# Patient Record
Sex: Male | Born: 1980 | Hispanic: No | Marital: Single | Smoking: Current every day smoker
Health system: Southern US, Community
[De-identification: ages and names within clinical notes are randomized; demographics above are authoritative.]

## PROBLEM LIST (undated history)

## (undated) DIAGNOSIS — Z87442 Personal history of urinary calculi: Secondary | ICD-10-CM

## (undated) HISTORY — PX: NO PAST SURGERIES: SHX2092

---

## 2013-09-14 ENCOUNTER — Ambulatory Visit: Payer: Self-pay

## 2016-08-07 ENCOUNTER — Other Ambulatory Visit: Payer: Self-pay | Admitting: Gastroenterology

## 2016-08-07 DIAGNOSIS — R1012 Left upper quadrant pain: Secondary | ICD-10-CM

## 2016-08-12 ENCOUNTER — Ambulatory Visit
Admission: RE | Admit: 2016-08-12 | Discharge: 2016-08-12 | Disposition: A | Discharge status: Home / Self Care | Payer: Commercial Managed Care - PPO | Source: Ambulatory Visit | Attending: Gastroenterology | Admitting: Gastroenterology

## 2016-08-12 DIAGNOSIS — R1012 Left upper quadrant pain: Secondary | ICD-10-CM

## 2016-08-12 MED ORDER — IOPAMIDOL (ISOVUE-300) INJECTION 61%
100.0000 mL | Freq: Once | INTRAVENOUS | Status: AC | PRN
  Administered 2016-08-12: 100 mL via INTRAVENOUS

## 2016-10-01 ENCOUNTER — Other Ambulatory Visit: Payer: Self-pay | Admitting: Urology

## 2016-10-02 ENCOUNTER — Encounter (HOSPITAL_COMMUNITY): Payer: Self-pay | Admitting: *Deleted

## 2016-10-02 NOTE — H&P (Signed)
HPI: Edward Wiggins is a 36 year-old male with a left renal calculus.  The problem is on the left side. This is his first kidney stone. He is currently having flank pain and nausea. He denies having back pain, groin pain, vomiting, fever, and chills. He has not caught a stone in his urine strainer since his symptoms began.   He has never had surgical treatment for calculi in the past.   36 year-old male, native of [place], comes in today, referred by Dr. Bosie ClosSchooler for evaluation management of left renal atrophy as well as a left sided kidney stone. He has had intermittent left flank pain for several years, perhaps for 10. He denies gross hematuria, dysuria, but he does have intermittent nausea. No significant vomiting. Recent CT scan revealed an 11 mm left renal stone with mild hydronephrosis.     CC/HPI: RENAL ATROPHY   On his CT scan, he does have a discrepancy between right and left renal size. This may well be secondary to the patient's long-standing left renal stone.     ALLERGIES: None   MEDICATIONS: None   GU PSH: None   NON-GU PSH: None   GU PMH: None   NON-GU PMH: None   FAMILY HISTORY: Death In The Family Father - Father   SOCIAL HISTORY: Marital Status: Single Current Smoking Status: Patient smokes.   Tobacco Use Assessment Completed: Used Tobacco in last 30 days? Does drink.  Drinks 2 caffeinated drinks per day.    REVIEW OF SYSTEMS:    GU Review Male:   Patient denies frequent urination, hard to postpone urination, burning/ pain with urination, get up at night to urinate, leakage of urine, stream starts and stops, trouble starting your stream, have to strain to urinate , erection problems, and penile pain.  Gastrointestinal (Upper):   Patient reports nausea. Patient denies vomiting and indigestion/ heartburn.  Gastrointestinal (Lower):   Patient denies diarrhea and constipation.  Constitutional:   Patient reports fatigue. Patient denies fever, night sweats, and  weight loss.  Skin:   Patient denies skin rash/ lesion and itching.  Eyes:   Patient denies blurred vision and double vision.  Ears/ Nose/ Throat:   Patient denies sore throat and sinus problems.  Hematologic/Lymphatic:   Patient denies swollen glands and easy bruising.  Cardiovascular:   Patient denies leg swelling and chest pains.  Respiratory:   Patient denies cough and shortness of breath.  Endocrine:   Patient denies excessive thirst.  Musculoskeletal:   Patient denies back pain and joint pain.  Neurological:   Patient denies headaches and dizziness.  Psychologic:   Patient denies depression and anxiety.   VITAL SIGNS:    Height 131 in / 332.74 cm  BP 117/75 mmHg  Pulse 71 /min  Temperature 99.2 F / 37 C   MULTI-SYSTEM PHYSICAL EXAMINATION:    Constitutional: Well-nourished. No physical deformities. Normally developed. Good grooming.  Neck: Neck symmetrical, not swollen. Normal tracheal position.  Respiratory: No labored breathing, no use of accessory muscles.   Skin: No paleness, no jaundice, no cyanosis. No lesion, no ulcer, no rash.  Neurologic / Psychiatric: Oriented to time, oriented to place, oriented to person. No depression, no anxiety, no agitation.  Gastrointestinal: No mass, no tenderness, no rigidity, non obese abdomen.  Eyes: Normal conjunctivae. Normal eyelids.  Ears, Nose, Mouth, and Throat: Left ear no scars, no lesions, no masses. Right ear no scars, no lesions, no masses. Nose no scars, no lesions, no masses. Normal hearing. Normal lips.  Musculoskeletal: Normal gait and station of head and neck.     PAST DATA REVIEWED:  Source Of History:  Patient  Records Review:   Previous Doctor Records  Urine Test Review:   Urinalysis  X-Ray Review: C.T. Abdomen/Pelvis: Reviewed Films. Reviewed Report. Discussed With Patient. Hounsfield units are approximately 450. Minimal skin to stone distance.     Urinalysis Dipstick Dipstick Cont'd  Color: Straw Bilirubin: Neg   Appearance: Clear Ketones: Neg  Specific Gravity: 1.010 Blood: Neg  pH: 7.0 Protein: Neg  Glucose: Neg Urobilinogen: 0.2    Nitrites: Neg    Leukocyte Esterase: Neg    ASSESSMENT/PLAN:      ICD-10 Details  1 GU:   Renal calculus - N20.0 moderate to large left renal pelvic stone. Hounsfield units approximately 450, although this can be seen on a scout film. Skin to stone distance approximately 7 cm. This has been symptomatic for some time. The patient is anxious to have this treated.   Dr. Retta Diones discussed, to the best of his ability, treatment options including lithotripsy using shockwave, ureteroscopy and holmium laser lithotripsy as well as percutaneous nephrolithotomy. This stone seems fairly amenable to shockwave lithotripsy due to the patient's low skin to stone distance and the low Hounsfield units. I think it is worth a shot at lithotripsy. I discussed the procedure with the patient and have given him a monograph about this.

## 2016-10-05 ENCOUNTER — Encounter (HOSPITAL_COMMUNITY)
Admission: RE | Disposition: A | Discharge status: Home / Self Care | Payer: Self-pay | Source: Ambulatory Visit | Attending: Urology

## 2016-10-05 ENCOUNTER — Encounter (HOSPITAL_COMMUNITY): Payer: Self-pay | Admitting: *Deleted

## 2016-10-05 ENCOUNTER — Ambulatory Visit (HOSPITAL_COMMUNITY): Payer: Commercial Managed Care - PPO

## 2016-10-05 ENCOUNTER — Ambulatory Visit (HOSPITAL_COMMUNITY)
Admission: RE | Admit: 2016-10-05 | Discharge: 2016-10-05 | Disposition: A | Discharge status: Home / Self Care | Payer: Commercial Managed Care - PPO | Source: Ambulatory Visit | Attending: Urology | Admitting: Urology

## 2016-10-05 DIAGNOSIS — N132 Hydronephrosis with renal and ureteral calculous obstruction: Secondary | ICD-10-CM | POA: Diagnosis present

## 2016-10-05 DIAGNOSIS — F17210 Nicotine dependence, cigarettes, uncomplicated: Secondary | ICD-10-CM | POA: Insufficient documentation

## 2016-10-05 DIAGNOSIS — N200 Calculus of kidney: Secondary | ICD-10-CM

## 2016-10-05 HISTORY — DX: Personal history of urinary calculi: Z87.442

## 2016-10-05 HISTORY — PX: EXTRACORPOREAL SHOCK WAVE LITHOTRIPSY: SHX1557

## 2016-10-05 SURGERY — LITHOTRIPSY, ESWL
Anesthesia: LOCAL | Laterality: Left

## 2016-10-05 MED ORDER — OXYCODONE HCL 10 MG PO TABS: 10.0000 mg | ORAL_TABLET | ORAL | 0 refills | Status: AC | PRN

## 2016-10-05 MED ORDER — LEVOFLOXACIN 500 MG PO TABS
500.0000 mg | ORAL_TABLET | ORAL | Status: AC
  Administered 2016-10-05: 500 mg via ORAL
  Filled 2016-10-05: qty 1

## 2016-10-05 MED ORDER — DIAZEPAM 5 MG PO TABS
10.0000 mg | ORAL_TABLET | ORAL | Status: AC
Start: 2016-10-05 — End: 2016-10-05
  Administered 2016-10-05: 10 mg via ORAL
  Filled 2016-10-05: qty 2

## 2016-10-05 MED ORDER — DIPHENHYDRAMINE HCL 25 MG PO CAPS
25.0000 mg | ORAL_CAPSULE | ORAL | Status: AC
  Administered 2016-10-05: 25 mg via ORAL
  Filled 2016-10-05: qty 1

## 2016-10-05 MED ORDER — SODIUM CHLORIDE 0.9 % IV SOLN
INTRAVENOUS | Status: DC
  Administered 2016-10-05: 06:00:00 via INTRAVENOUS

## 2016-10-05 MED ORDER — TAMSULOSIN HCL 0.4 MG PO CAPS: 0.4000 mg | ORAL_CAPSULE | ORAL | 0 refills | Status: AC

## 2016-10-05 NOTE — Discharge Instructions (Signed)
Moderate Conscious Sedation, Adult, Care After °These instructions provide you with information about caring for yourself after your procedure. Your health care provider may also give you more specific instructions. Your treatment has been planned according to current medical practices, but problems sometimes occur. Call your health care provider if you have any problems or questions after your procedure. °What can I expect after the procedure? °After your procedure, it is common: °· To feel sleepy for several hours. °· To feel clumsy and have poor balance for several hours. °· To have poor judgment for several hours. °· To vomit if you eat too soon. °Follow these instructions at home: °For at least 24 hours after the procedure:  ° °· Do not: °¨ Participate in activities where you could fall or become injured. °¨ Drive. °¨ Use heavy machinery. °¨ Drink alcohol. °¨ Take sleeping pills or medicines that cause drowsiness. °¨ Make important decisions or sign legal documents. °¨ Take care of children on your own. °· Rest. °Eating and drinking  °· Follow the diet recommended by your health care provider. °· If you vomit: °¨ Drink water, juice, or soup when you can drink without vomiting. °¨ Make sure you have little or no nausea before eating solid foods. °General instructions  °· Have a responsible adult stay with you until you are awake and alert. °· Take over-the-counter and prescription medicines only as told by your health care provider. °· If you smoke, do not smoke without supervision. °· Keep all follow-up visits as told by your health care provider. This is important. °Contact a health care provider if: °· You keep feeling nauseous or you keep vomiting. °· You feel light-headed. °· You develop a rash. °· You have a fever. °Get help right away if: °· You have trouble breathing. °This information is not intended to replace advice given to you by your health care provider. Make sure you discuss any questions you  have with your health care provider. °Document Released: 02/22/2013 Document Revised: 10/07/2015 Document Reviewed: 08/24/2015 °Elsevier Interactive Patient Education © 2017 [employer] °Lithotripsy, Care After °This sheet gives you information about how to care for yourself after your procedure. Your health care provider may also give you more specific instructions. If you have problems or questions, contact your health care provider. °What can I expect after the procedure? °After the procedure, it is common to have: °· Some blood in your urine. This should only last for a few days. °· Soreness in your back, sides, or upper abdomen for a few days. °· Blotches or bruises on your back where the pressure wave entered the skin. °· Pain, discomfort, or nausea when pieces (fragments) of the kidney stone move through the tube that carries urine from the kidney to the bladder (ureter). Stone fragments may pass soon after the procedure, but they may continue to pass for up to 4-8 weeks. °¨ If you have severe pain or nausea, contact your health care provider. This may be caused by a large stone that was not broken up, and this may mean that you need more treatment. °· Some pain or discomfort during urination. °· Some pain or discomfort in the lower abdomen or (in men) at the base of the penis. °Follow these instructions at home: °Medicines  °· Take over-the-counter and prescription medicines only as told by your health care provider. °· If you were prescribed an antibiotic medicine, take it as told by your health care provider. Do not stop taking the antibiotic even   if you start to feel better. °· Do not drive for 24 hours if you were given a medicine to help you relax (sedative). °· Do not drive or use heavy machinery while taking prescription pain medicine. °Eating and drinking  °· Drink enough water and fluids to keep your urine clear or pale yellow. This helps any remaining pieces of the stone to pass. It can also help  prevent new stones from forming. °· Eat plenty of fresh fruits and vegetables. °· Follow instructions from your health care provider about eating and drinking restrictions. You may be instructed: °¨ To reduce how much salt (sodium) you eat or drink. Check ingredients and nutrition facts on packaged foods and beverages. °¨ To reduce how much meat you eat. °· Eat the recommended amount of calcium for your age and gender. Ask your health care provider how much calcium you should have. °General instructions  °· Get plenty of rest. °· Most people can resume normal activities 1-2 days after the procedure. Ask your health care provider what activities are safe for you. °· If directed, strain all urine through the strainer that was provided by your health care provider. °¨ Keep all fragments for your health care provider to see. Any stones that are found may be sent to a medical lab for examination. The stone may be as small as a grain of salt. °· Keep all follow-up visits as told by your health care provider. This is important. °Contact a health care provider if: °· You have pain that is severe or does not get better with medicine. °· You have nausea that is severe or does not go away. °· You have blood in your urine longer than your health care provider told you to expect. °· You have more blood in your urine. °· You have pain during urination that does not go away. °· You urinate more frequently than usual and this does not go away. °· You develop a rash or any other possible signs of an allergic reaction. °Get help right away if: °· You have severe pain in your back, sides, or upper abdomen. °· You have severe pain while urinating. °· Your urine is very dark red. °· You have blood in your stool (feces). °· You cannot pass any urine at all. °· You feel a strong urge to urinate after emptying your bladder. °· You have a fever or chills. °· You develop shortness of breath, difficulty breathing, or chest pain. °· You have  severe nausea that leads to persistent vomiting. °· You faint. °Summary °· After this procedure, it is common to have some pain, discomfort, or nausea when pieces (fragments) of the kidney stone move through the tube that carries urine from the kidney to the bladder (ureter). If this pain or nausea is severe, however, you should contact your health care provider. °· Most people can resume normal activities 1-2 days after the procedure. Ask your health care provider what activities are safe for you. °· Drink enough water and fluids to keep your urine clear or pale yellow. This helps any remaining pieces of the stone to pass, and it can help prevent new stones from forming. °· If directed, strain your urine and keep all fragments for your health care provider to see. Fragments or stones may be as small as a grain of salt. °· Get help right away if you have severe pain in your back, sides, or upper abdomen or have severe pain while urinating. ° ° °

## 2016-10-05 NOTE — Op Note (Signed)
See [facility] OP note scanned into chart. Also because of the size, density, location and other factors that cannot be anticipated I feel this will likely be a staged procedure. This fact supersedes any indication in the scanned [facility] operative note to the contrary.  

## 2016-10-06 ENCOUNTER — Encounter (HOSPITAL_COMMUNITY): Payer: Self-pay | Admitting: Urology

## 2016-11-11 NOTE — Congregational Nurse Program (Signed)
Congregational Nurse Program Note  Date of Encounter: 10/20/2016  Past Medical History: Past Medical History:  Diagnosis Date  . History of kidney stones     Encounter Details:     CNP Questionnaire - 10/20/16 1200      Patient Demographics   Is this a new or existing patient? Existing   Patient is considered a/an Refugee   Race African     Patient Assistance   Location of Patient Assistance Not Applicable   Patient's financial/insurance status Private Insurance Coverage   Uninsured Patient ([employer]) No   Patient referred to apply for the following financial assistance Not Applicable   Food insecurities addressed Referred to food bank or resource   Transportation assistance No   Assistance securing medications No   Educational health offerings Nutrition;Spiritual care     Encounter Details   Primary purpose of visit Spiritual Care/Support Visit;Other   Was an Emergency Department visit averted? Not Applicable   Does patient have a medical provider? Yes   Patient referred to Follow up with established PCP   Was a mental health screening completed? (GAINS tool) No   Does patient have dental issues? No   Does patient have vision issues? No   Does your patient have an abnormal blood pressure today? No   Since previous encounter, have you referred patient for abnormal blood pressure that resulted in a new diagnosis or medication change? No   Does your patient have an abnormal blood glucose today? No   Since previous encounter, have you referred patient for abnormal blood glucose that resulted in a new diagnosis or medication change? No   Was there a life-saving intervention made? No     Brief visit to express concern about weight and to provide update about  recent treatment for "kidney stone." Describes condition today as good. Tall and slender man appearing slightly  thinner than original size one year ago. Below percentile for height. Concerned that weight  is less than normal. 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 Requesting assistance with food through DSS. Referred to agency social worker for assistance and application food assistane. Nutritional information provided regarding proteins and eating a balanced diet. Follow-up with provider regarding recent surgery. Return prn. Ferol LuzMarietta Celia Friedland, RN/CN

## 2018-07-24 IMAGING — CR DG ABDOMEN 1V
1 series · 1 of 1 positions shown · non-contrast
Comparison: Abdominal CT 08/12/2016 and abdominal radiograph
09/30/2016

CLINICAL DATA: Preop for removal of left-sided renal stone

EXAM:
ABDOMEN - 1 VIEW

[t abdomen supine]
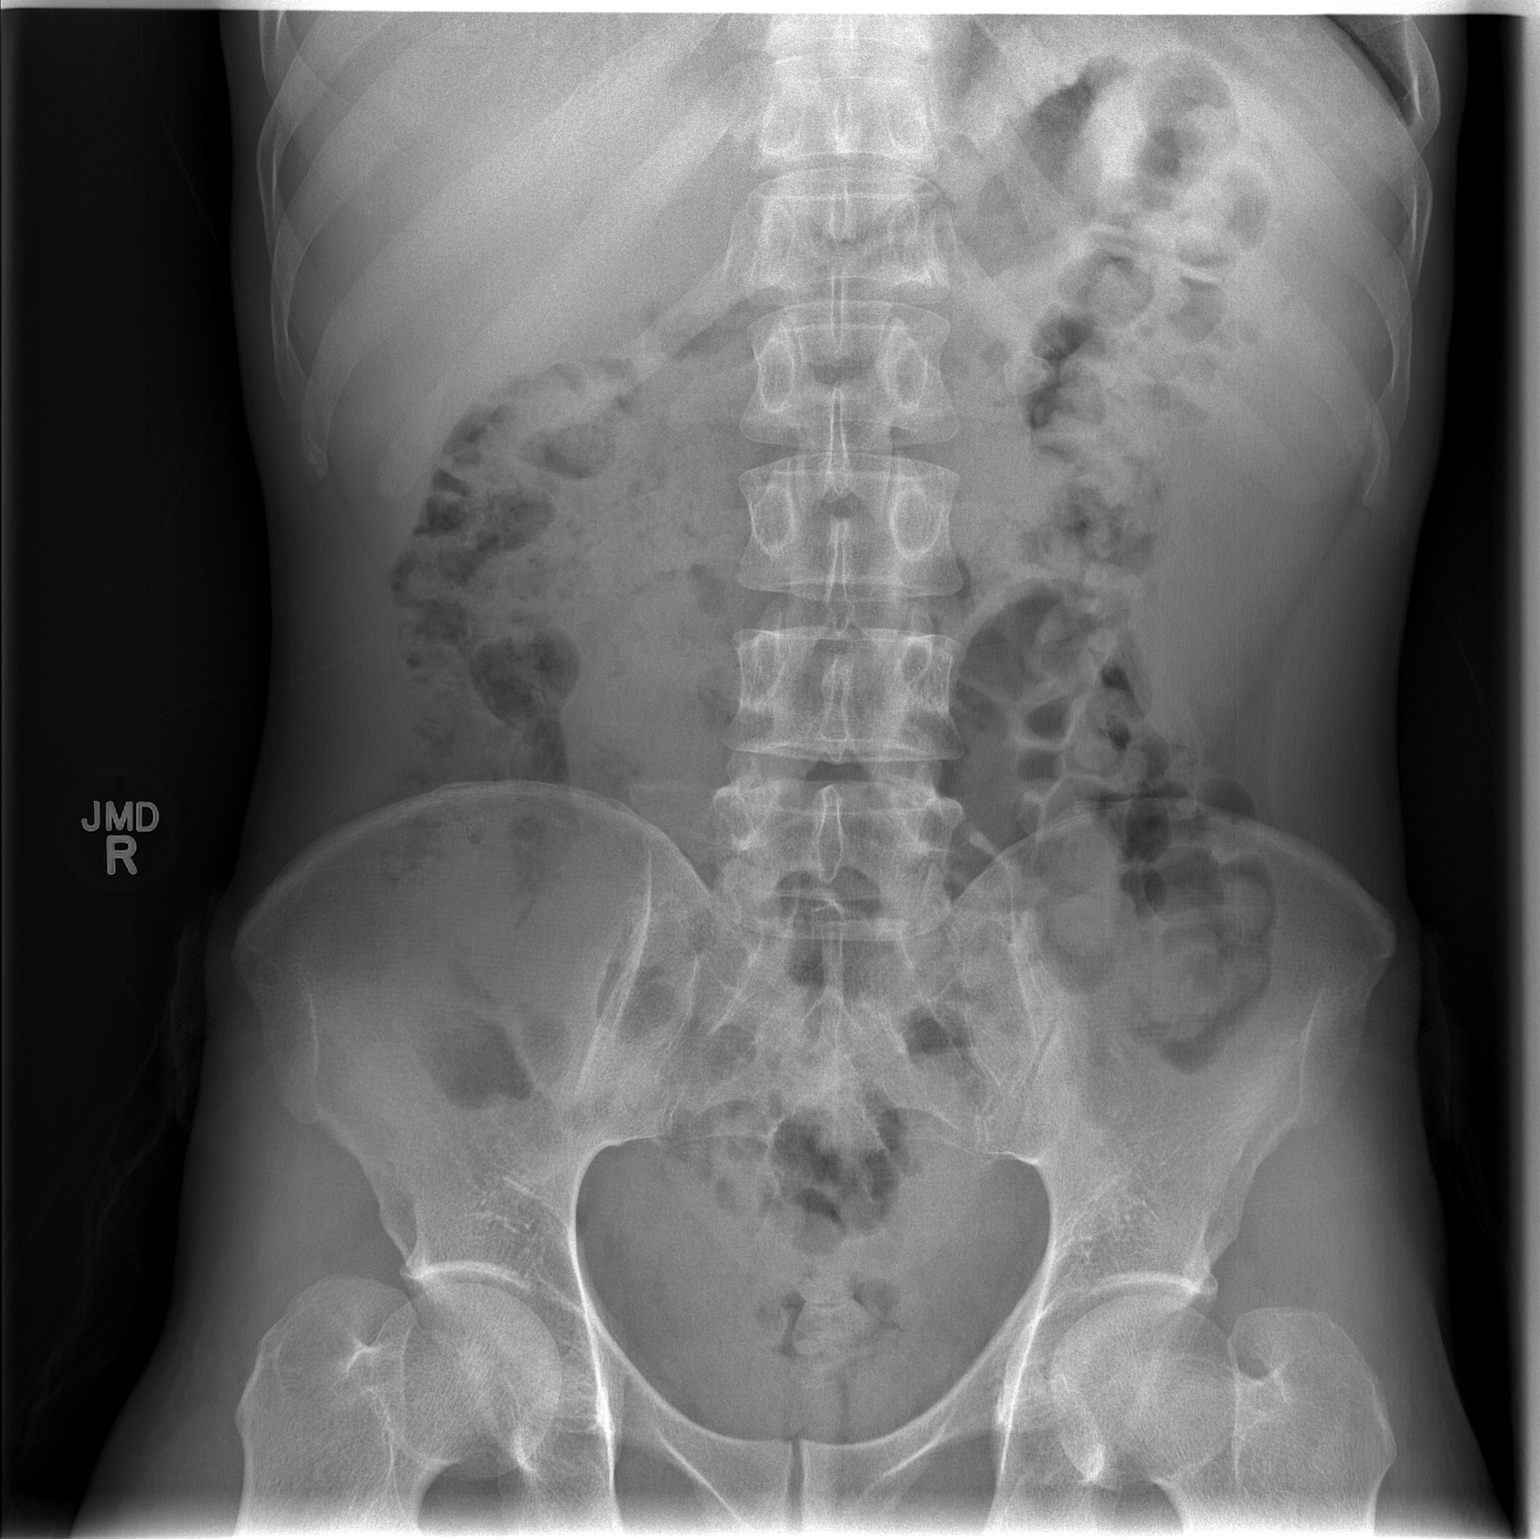

[1 of 1 positions shown; findings below may reference images not displayed]

FINDINGS: Renal stone measuring 13 x 10 mm at the left renal pelvis is in
unchanged position. Bowel gas pattern is normal.
IMPRESSION: Unchanged size and position of 13 x 10 mm left renal pelvis stone.
